# Patient Record
Sex: Female | Born: 1995 | Race: Black or African American | Hispanic: No | Marital: Single | Smoking: Never smoker
Health system: Southern US, Community
[De-identification: ages and names within clinical notes are randomized; demographics above are authoritative.]

## PROBLEM LIST (undated history)

## (undated) DIAGNOSIS — J302 Other seasonal allergic rhinitis: Secondary | ICD-10-CM

---

## 2014-12-11 ENCOUNTER — Encounter (HOSPITAL_COMMUNITY): Payer: Self-pay | Admitting: Emergency Medicine

## 2014-12-11 ENCOUNTER — Emergency Department (HOSPITAL_COMMUNITY)
Admission: EM | Admit: 2014-12-11 | Discharge: 2014-12-11 | Disposition: A | Discharge status: Home / Self Care | Payer: Managed Care, Other (non HMO) | Attending: Emergency Medicine | Admitting: Emergency Medicine

## 2014-12-11 ENCOUNTER — Emergency Department (HOSPITAL_COMMUNITY): Payer: Managed Care, Other (non HMO)

## 2014-12-11 DIAGNOSIS — Y9389 Activity, other specified: Secondary | ICD-10-CM | POA: Insufficient documentation

## 2014-12-11 DIAGNOSIS — S9031XA Contusion of right foot, initial encounter: Secondary | ICD-10-CM | POA: Diagnosis not present

## 2014-12-11 DIAGNOSIS — W1789XA Other fall from one level to another, initial encounter: Secondary | ICD-10-CM | POA: Diagnosis not present

## 2014-12-11 DIAGNOSIS — Y9289 Other specified places as the place of occurrence of the external cause: Secondary | ICD-10-CM | POA: Insufficient documentation

## 2014-12-11 DIAGNOSIS — S99921A Unspecified injury of right foot, initial encounter: Secondary | ICD-10-CM | POA: Diagnosis present

## 2014-12-11 DIAGNOSIS — Y9344 Activity, trampolining: Secondary | ICD-10-CM | POA: Insufficient documentation

## 2014-12-11 DIAGNOSIS — Y998 Other external cause status: Secondary | ICD-10-CM | POA: Insufficient documentation

## 2014-12-11 HISTORY — DX: Other seasonal allergic rhinitis: J30.2

## 2014-12-11 MED ORDER — HYDROCODONE-ACETAMINOPHEN 5-325 MG PO TABS
1.0000 | ORAL_TABLET | Freq: Once | ORAL | Status: AC
  Administered 2014-12-11: 1 via ORAL
  Filled 2014-12-11: qty 1

## 2014-12-11 MED ORDER — NAPROXEN 500 MG PO TABS: 500.0000 mg | ORAL_TABLET | Freq: Two times a day (BID) | ORAL | Status: AC

## 2014-12-11 NOTE — ED Provider Notes (Signed)
CSN: [id number]     Arrival date & time 12/11/14  2011 History  This chart was scribed for [facility], NP, working with Linwood Dibbles, MD by Elon Spanner, ED Scribe. This patient was seen in room TR09C/TR09C and the patient's care was started at 8:35 PM.   Chief Complaint  Patient presents with  . Foot Injury   The history is provided by the patient. No language interpreter was used.   HPI Comments: Lori Tucker is a 19 y.o. female who presents to the Emergency Department complaining of sudden onset, moderate right ankle pain and swelling onset PTA; patient has used an ice pack but no other treatments have been tried.  The patient was at a trampoline park and landed on the solid area surrounding the trampoline. Immediate pain to the side of her foot. Denies any other injuries.    Past Medical History  Diagnosis Date  . Seasonal allergies    History reviewed. No pertinent past surgical history. No family history on file. Social History  Substance Use Topics  . Smoking status: Never Smoker   . Smokeless tobacco: None  . Alcohol Use: Yes   OB History    No data available     Review of Systems  Musculoskeletal: Positive for joint swelling and arthralgias.  all other systems negative    Allergies  Review of patient's allergies indicates no known allergies.  Home Medications   Prior to Admission medications   Medication Sig Start Date End Date Taking? Authorizing Provider  naproxen (NAPROSYN) 500 MG tablet Take 1 tablet (500 mg total) by mouth 2 (two) times daily. 12/11/14   Braxen Dobek Orlene Och, NP   BP 132/62 mmHg  Pulse 64  Temp(Src) 98.8 F (37.1 C) (Oral)  Resp 16  LMP 12/06/2014 Physical Exam  Constitutional: She is oriented to person, place, and time. She appears well-developed and well-nourished. No distress.  HENT:  Head: Normocephalic and atraumatic.  Eyes: Conjunctivae and EOM are normal.  Neck: Neck supple. No tracheal deviation present.  Cardiovascular: Normal  rate.   Pulmonary/Chest: Effort normal. No respiratory distress.  Musculoskeletal: Normal range of motion.  DP pulses 2+, equal.   Right foot: Adequate circulation swelling noted to medial aspect of the right foot near the arch.  TTP   Neurological: She is alert and oriented to person, place, and time.  Skin: Skin is warm and dry.  Psychiatric: She has a normal mood and affect. Her behavior is normal.  Nursing note and vitals reviewed.   ED Course  Procedures (including critical care time)  DIAGNOSTIC STUDIES: Oxygen Saturation is 100% on RA, normal by my interpretation.    COORDINATION OF CARE:  8:39 PM Will order imaging and pain medication.  Patient acknowledges and agrees with plan.    Labs Review Labs Reviewed - No data to display  Imaging Review Dg Foot Complete Right  12/11/2014   CLINICAL DATA:  Right foot injury at trampoline park today. Proximal foot pain and swelling with some pain extending to the toes. Initial encounter.  EXAM: RIGHT FOOT COMPLETE - 3+ VIEW  COMPARISON:  None.  FINDINGS: No acute fracture or dislocation is identified. There is mild soft tissue swelling, most notably along the medial aspect of the mid to forefoot. No radiopaque foreign body. No lytic or blastic osseous lesion.  IMPRESSION: Soft tissue swelling without acute osseous abnormality identified.   Electronically Signed   By: Sebastian Ache M.D.   On: 12/11/2014 22:15  MDM  19 y.o. female with pain and swelling to the right foot s/p injury. Stable for d/c without neurovascular compromise. Ace wrap, ice, elevation and NSAIDS for pain and inflammation. Discussed with the patient and all questioned fully answered. She will return if any problems arise.   Final diagnoses:  Contusion of right foot, initial encounter   I personally performed the services described in this documentation, which was scribed in my presence. The recorded information has been reviewed and is accurate.    [place],  [place] 12/11/14 2224  Linwood Dibbles, MD 12/12/14 2039

## 2014-12-11 NOTE — ED Notes (Signed)
Patient left at this time with all belongings. Escorted to lobby in wheelchair and states she will be driven home by a friend.

## 2014-12-11 NOTE — ED Notes (Signed)
Pt. presents with right foot pain /swelling injured today while jumping on a trampoline .

## 2017-05-09 IMAGING — DX DG FOOT COMPLETE 3+V*R*
3 series · 3 of 3 positions shown · non-contrast
Comparison: None.

CLINICAL DATA: Right foot injury at trampoline park today. Proximal
foot pain and swelling with some pain extending to the toes. Initial
encounter.

EXAM:
RIGHT FOOT COMPLETE - 3+ VIEW

[x foot ap right]
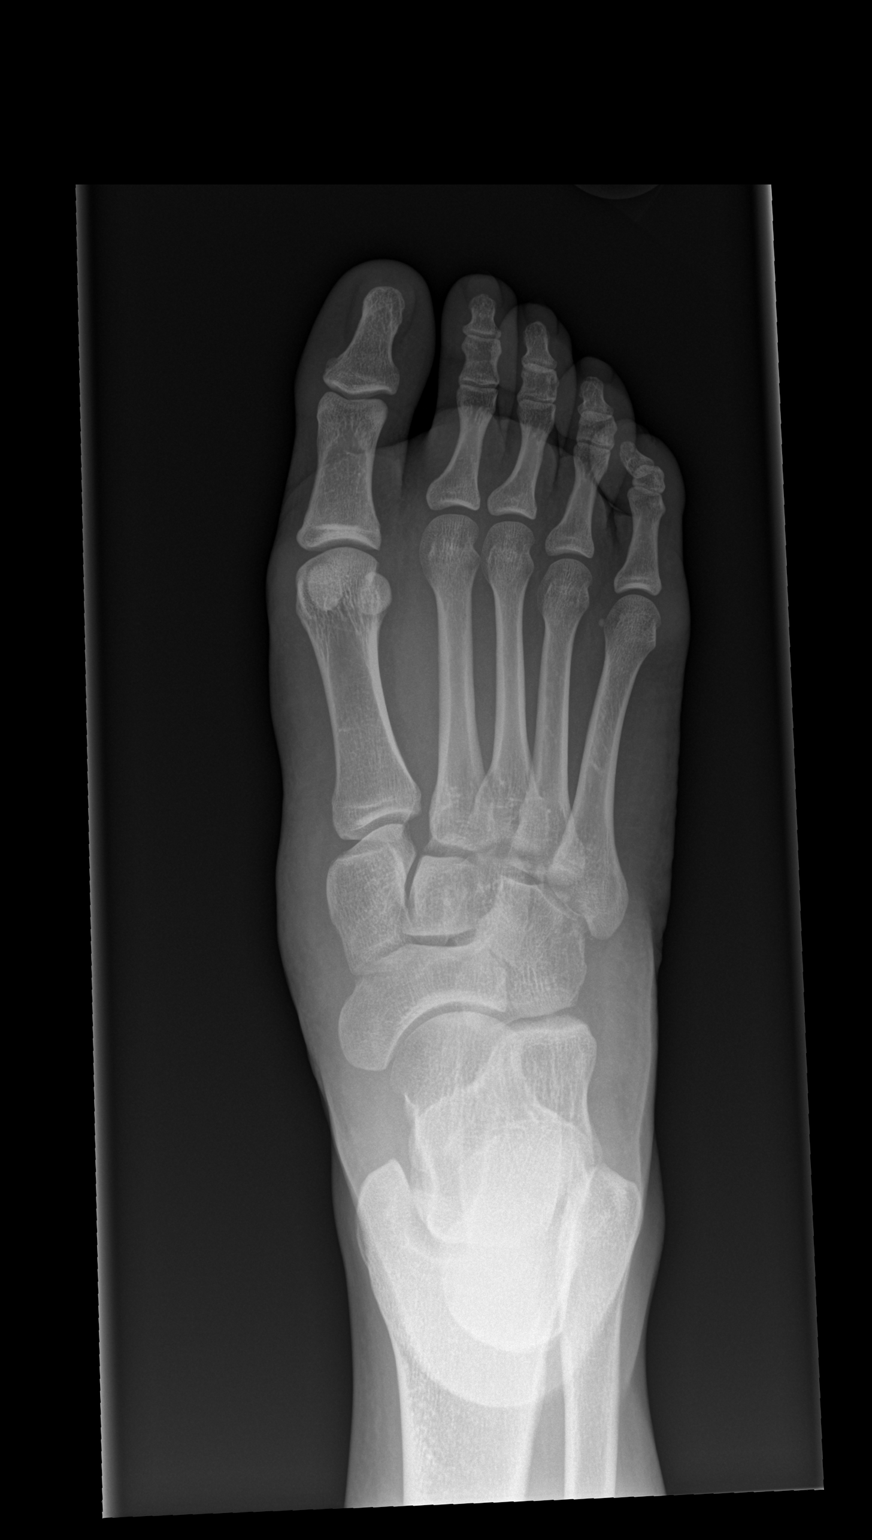

[x foot obl right]
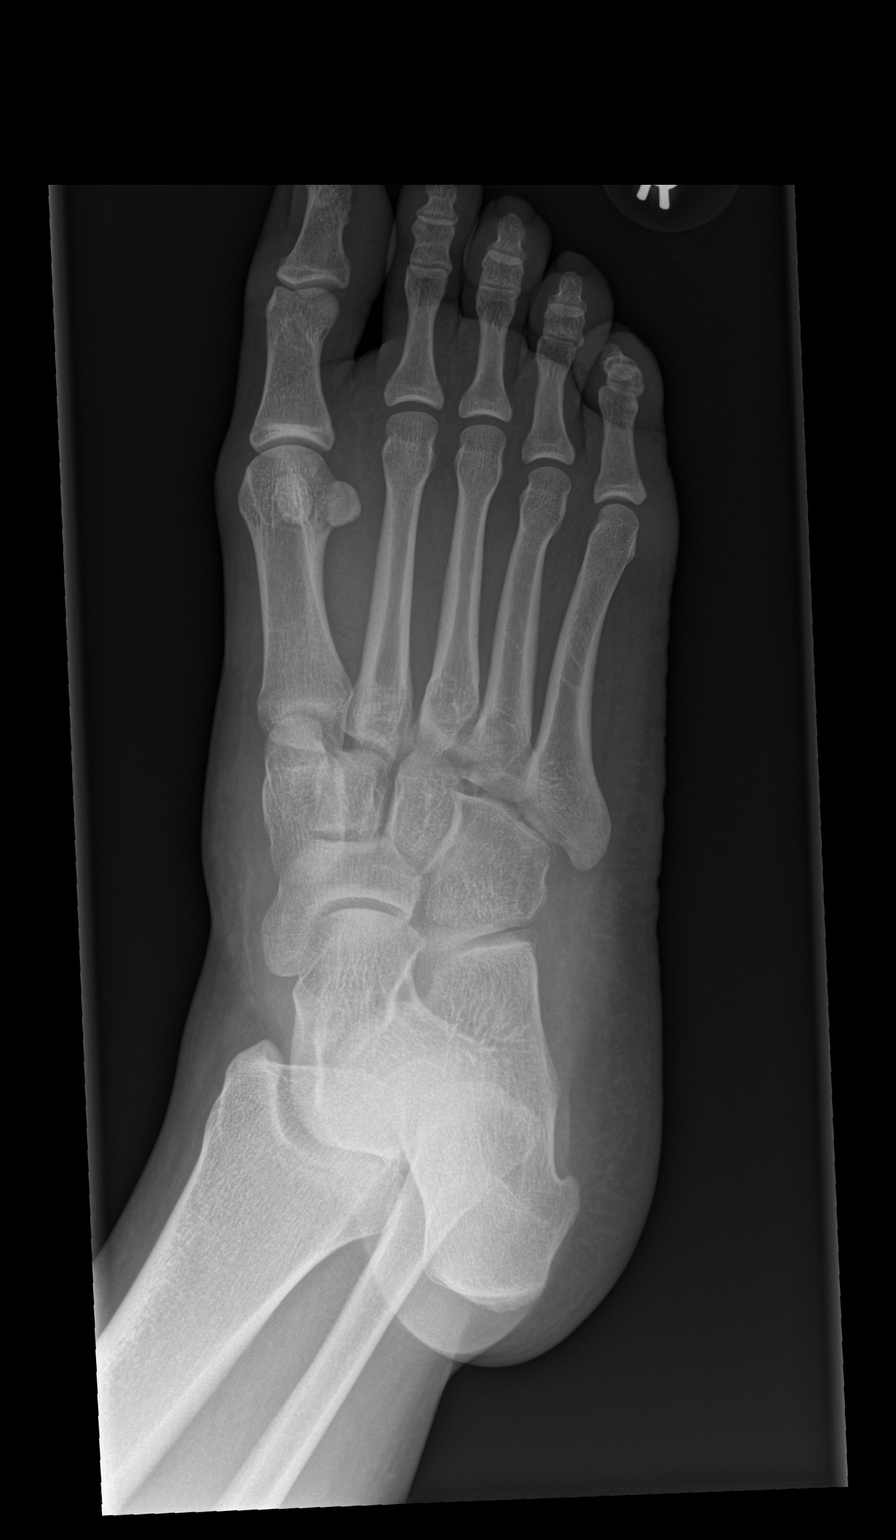

[x foot lat right]
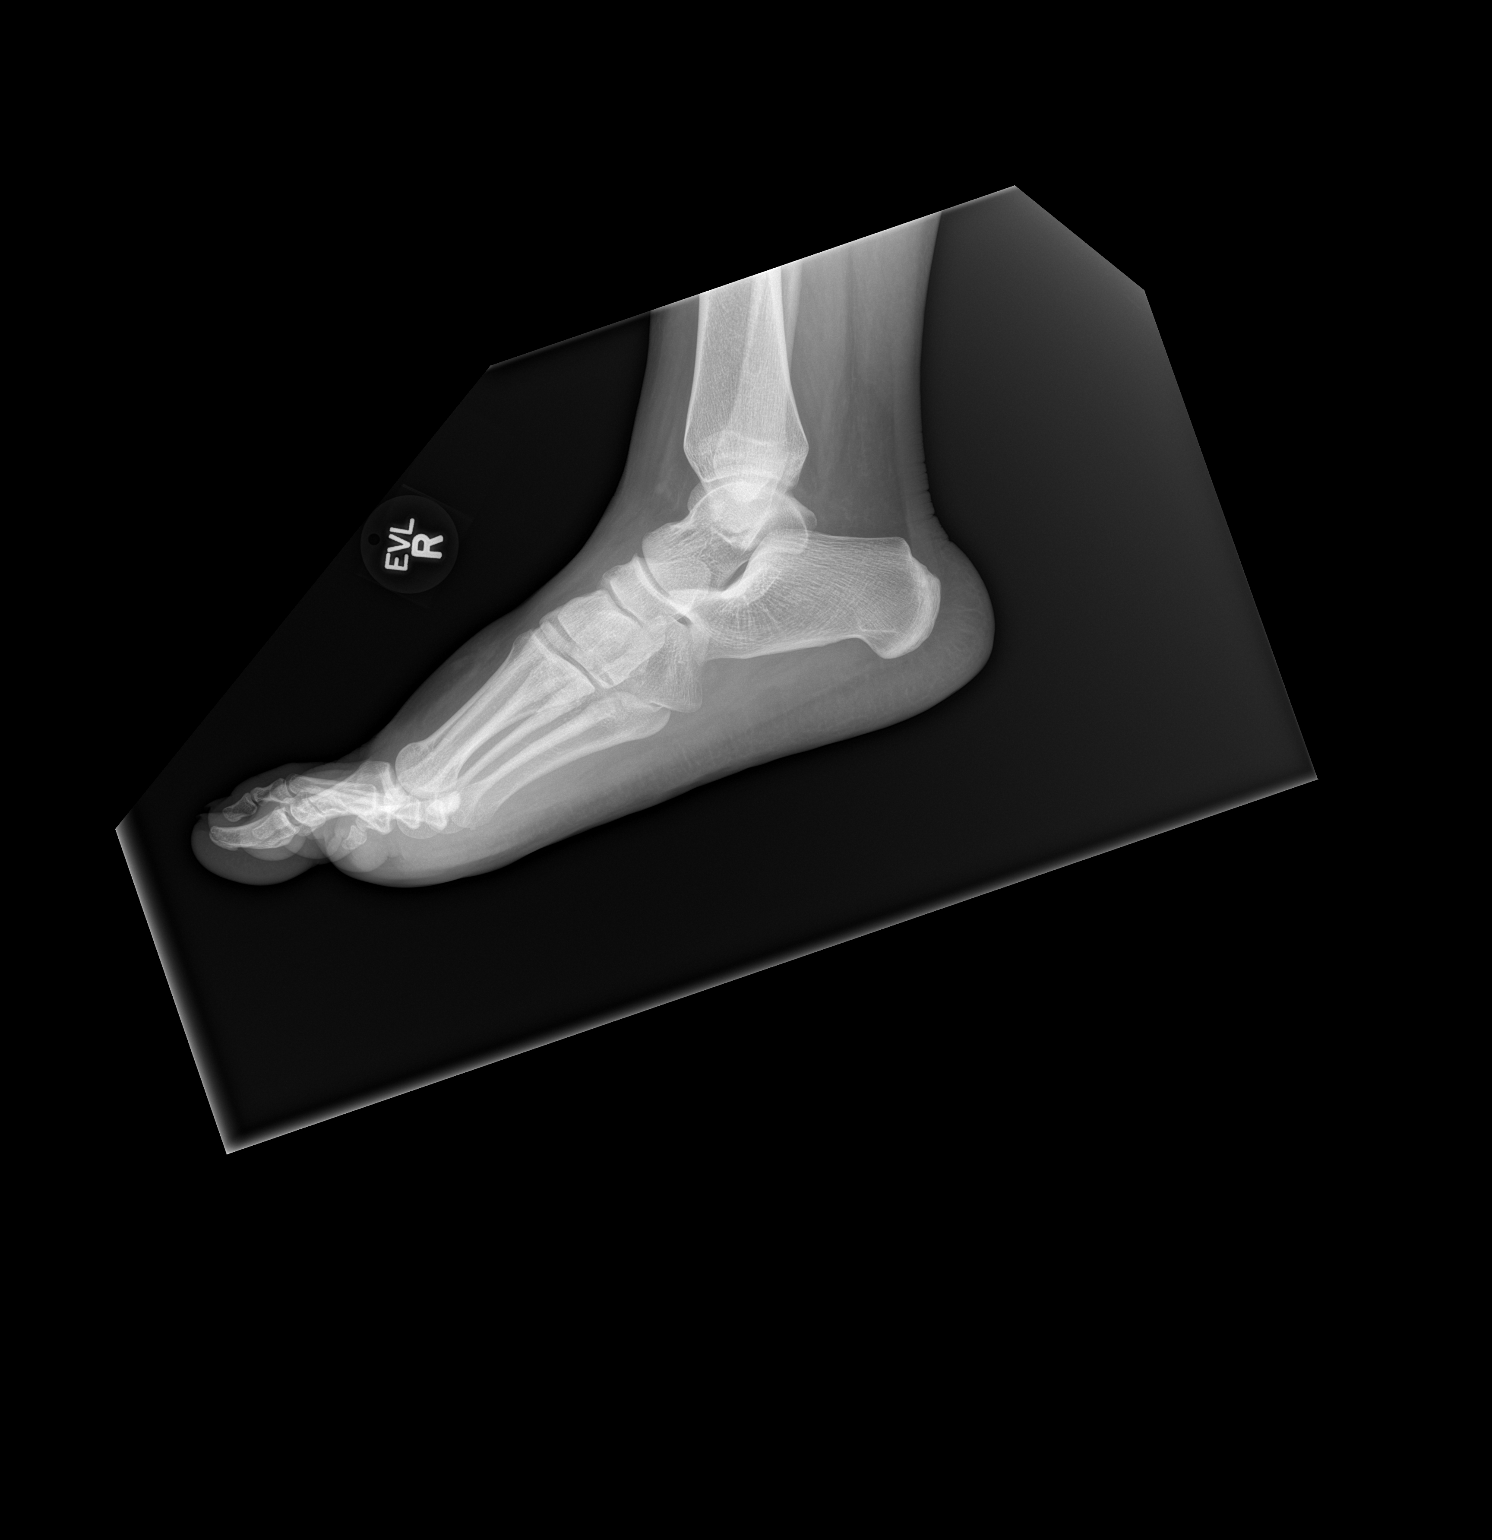

[3 of 3 positions shown; findings below may reference images not displayed]

FINDINGS: No acute fracture or dislocation is identified. There is mild soft
tissue swelling, most notably along the medial aspect of the mid to
forefoot. No radiopaque foreign body. No lytic or blastic osseous
lesion.
IMPRESSION: Soft tissue swelling without acute osseous abnormality identified.
